# Patient Record
Sex: Male | Born: 2005 | Race: Black or African American | Hispanic: No | Marital: Single | Smoking: Never smoker
Health system: Southern US, Community
[De-identification: ages and names within clinical notes are randomized; demographics above are authoritative.]

## PROBLEM LIST (undated history)

## (undated) HISTORY — PX: OTHER SURGICAL HISTORY: SHX169

---
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
## 2023-11-27 IMAGING — MR MR HIP*R* W/O CM
4 of 6 series · 22 of 40 positions shown · non-contrast
Comparison: None.

CLINICAL DATA: Right hip pain

EXAM:
MR OF THE RIGHT HIP WITHOUT CONTRAST
TECHNIQUE: Multiplanar, multisequence MR imaging was performed. No intravenous
contrast was administered.

[Series 3: T1 · coronal · 4.0mm · 0.85mm/px · 6 of 36 slices shown]
[im 1/36]
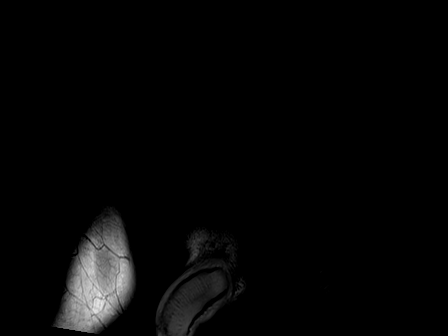
[im 6/36]
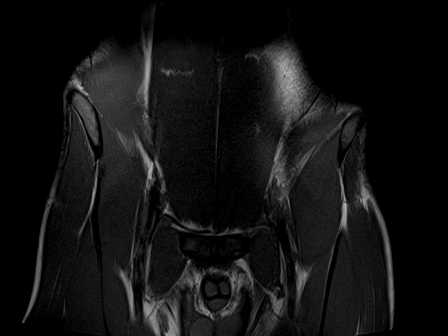
[im 11/36]
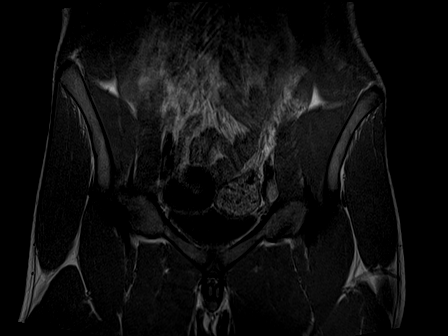
[im 16/36]
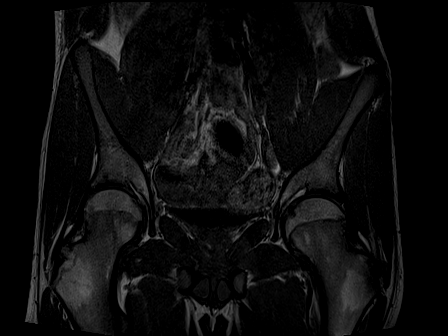
[im 21/36]
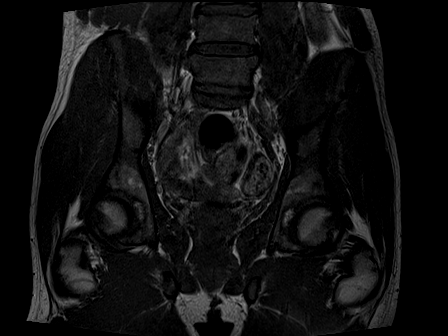
[im 31/36]
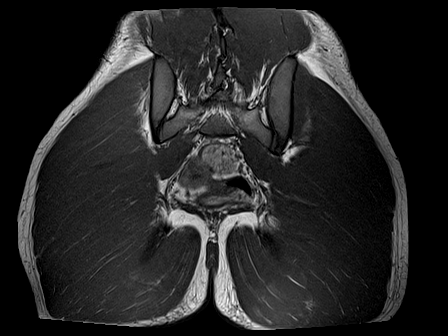

[Series 6: PD fat-sat · sagittal · 4.5mm · 0.35mm/px · 6 of 25 slices shown (1 of 3)]
[im 1/25]
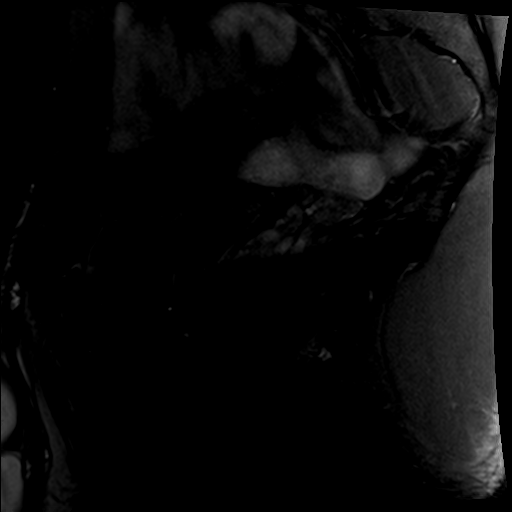
[im 5/25]
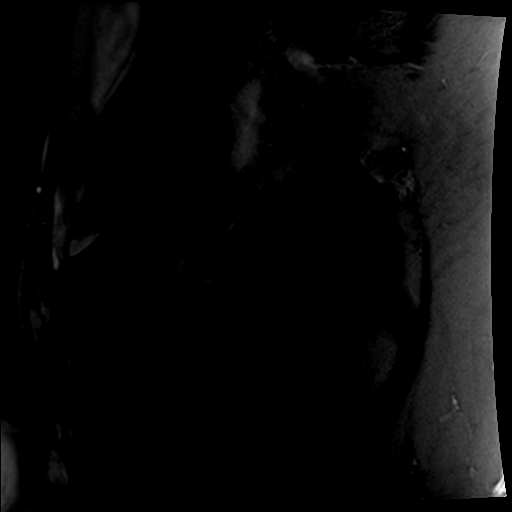
[im 10/25]
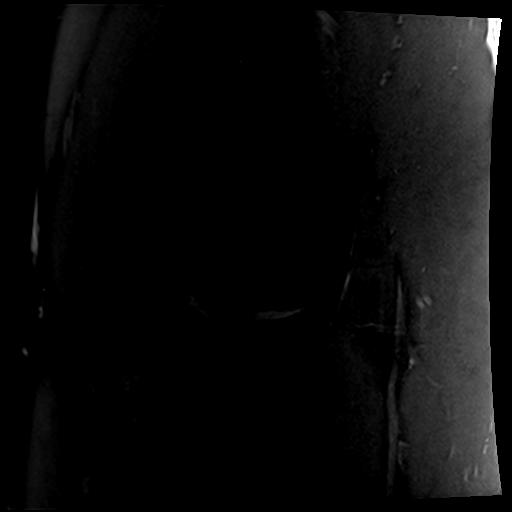
[im 15/25]
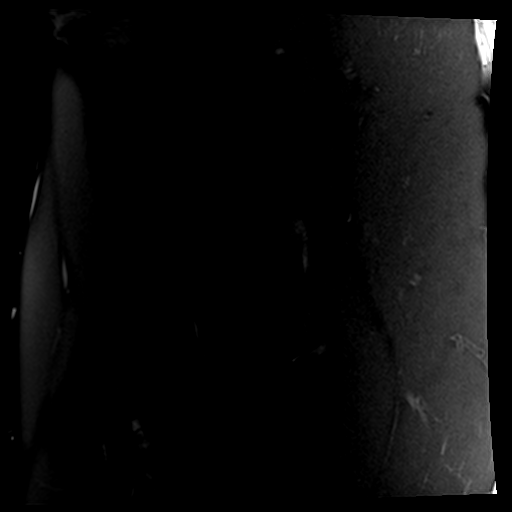
[im 20/25]
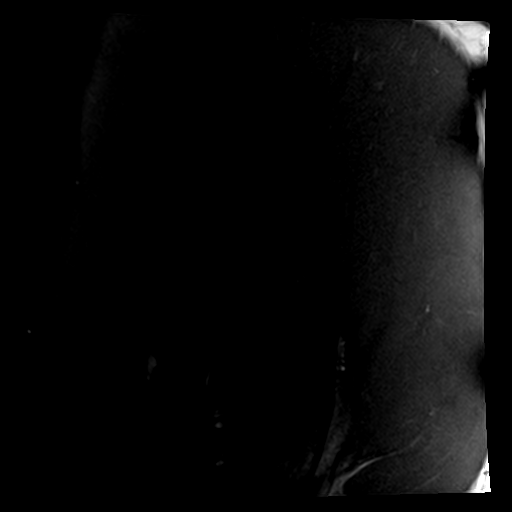
[im 25/25]
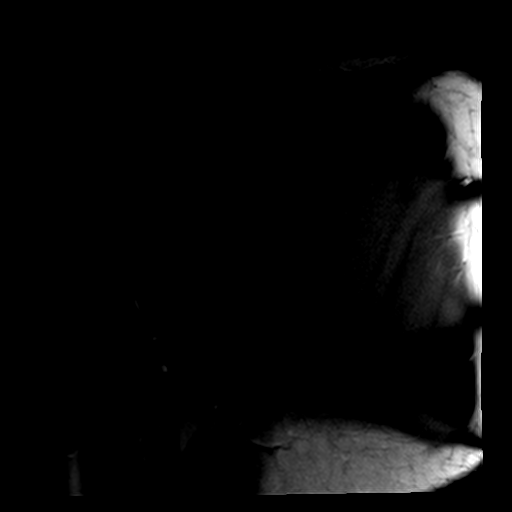

[Series 7: PD fat-sat · coronal · 4.5mm · 0.35mm/px · 5 of 23 slices shown (2 of 3)]
[im 1/23]
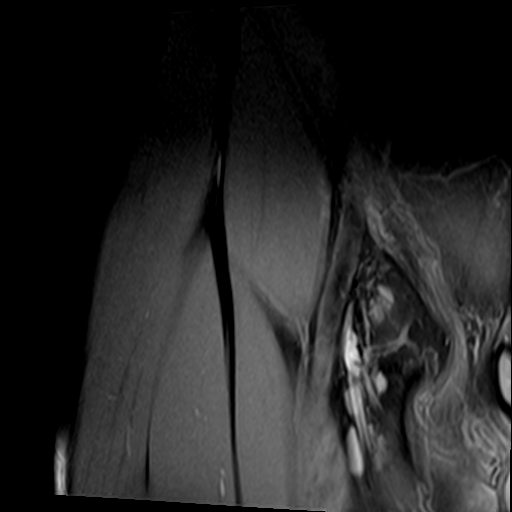
[im 6/23]
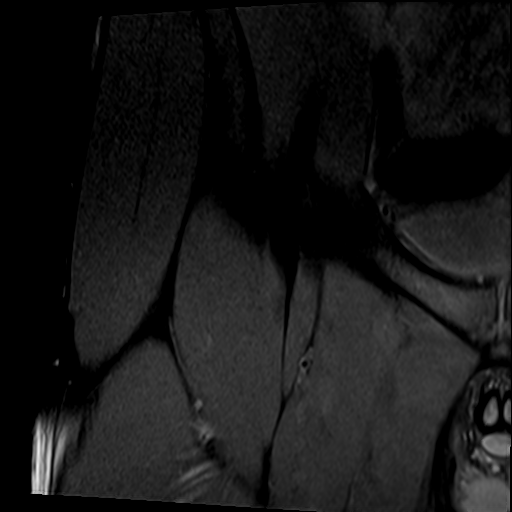
[im 12/23]
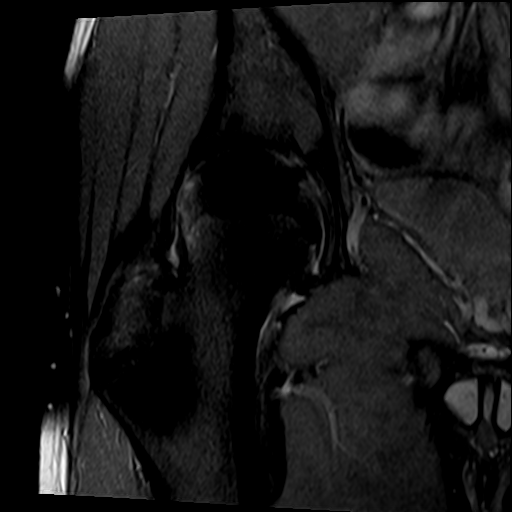
[im 17/23]
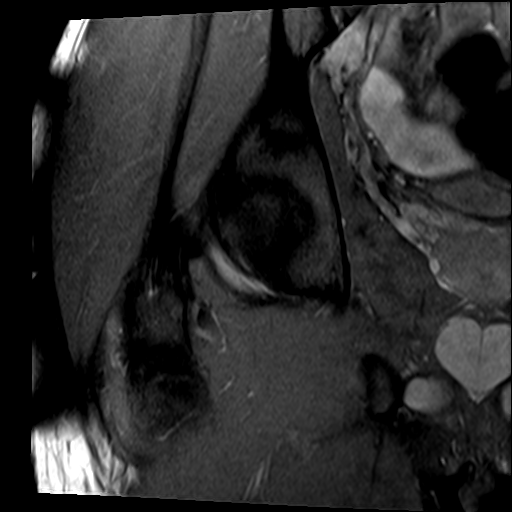
[im 23/23]
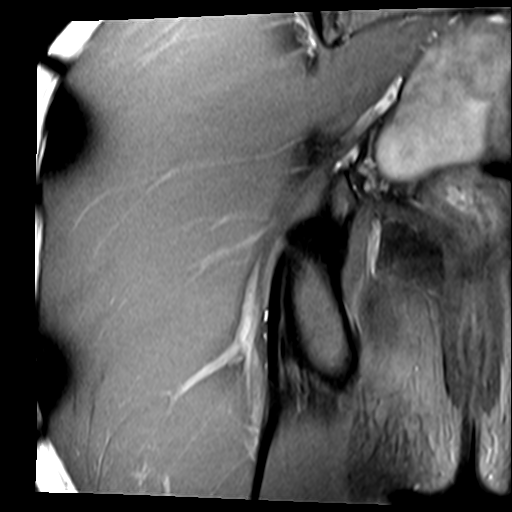

[Series 8: PD fat-sat · axial · 4.5mm · 0.35mm/px · z∈[-176,-73]mm · 5 of 23 slices shown (3 of 3)]
[im 1/23]
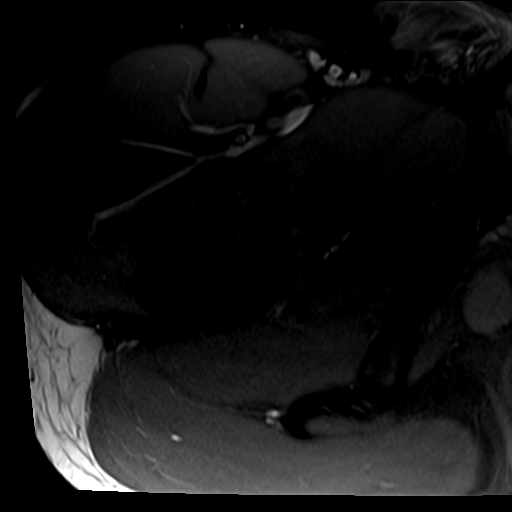
[im 6/23]
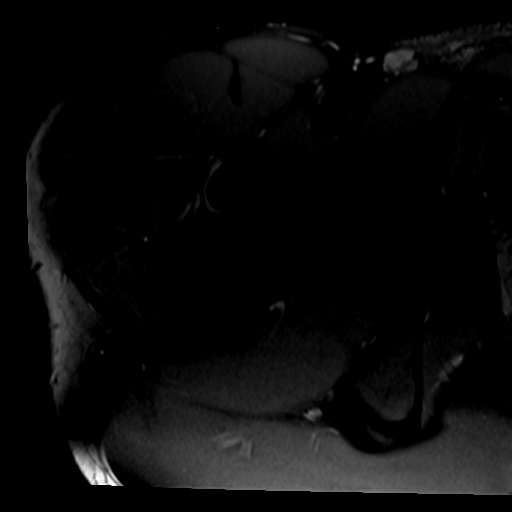
[im 12/23]
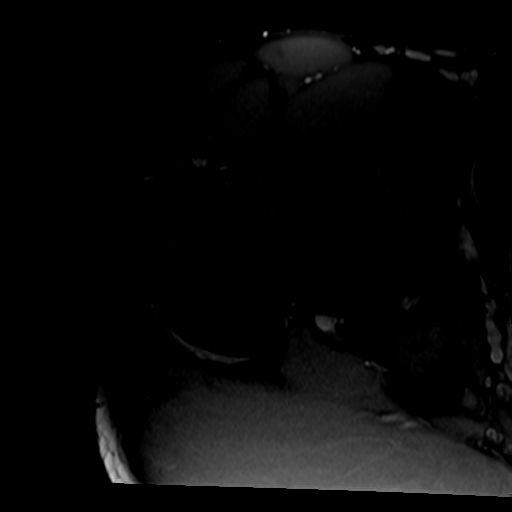
[im 17/23]
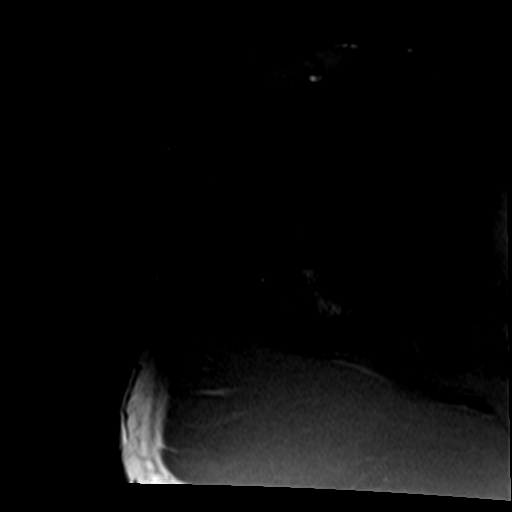
[im 23/23]
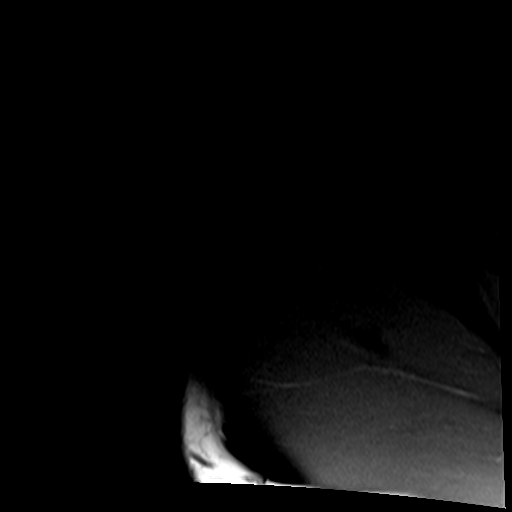

[22 of 40 positions shown; findings below may reference images not displayed]

FINDINGS: Bones:

No hip fracture, dislocation or avascular necrosis. T2 hyperintense
signal along the superior lateral femoral head bilaterally, right
greater than left, likely reflecting red marrow and less likely
reactive marrow edema.

No periosteal reaction or bone destruction. No aggressive osseous
lesion.

Normal sacrum and sacroiliac joints. No SI joint widening or erosive
changes.

Articular cartilage and labrum

Articular cartilage:  No chondral defect.

Labrum:  Right superior anterior labral tear.

Joint or bursal effusion

Joint effusion:  No hip joint effusion.  No SI joint effusion.

Bursae:  No bursa formation.

Muscles and tendons

Flexors: Normal.

Extensors: Normal.

Abductors: Normal.

Adductors: Normal.

Gluteals: Normal.

Hamstrings: Normal.

Other findings

Small amount of pelvic free fluid likely physiologic. No fluid
collection or hematoma. No inguinal lymphadenopathy. No inguinal
hernia.
IMPRESSION: 1. Right superior anterior labral tear.
2. No hip fracture, dislocation or avascular necrosis.
